# Patient Record
Sex: Female | Born: 1980 | Race: Black or African American | Hispanic: No | Marital: Single | Smoking: Current some day smoker
Health system: Southern US, Community
[De-identification: ages and names within clinical notes are randomized; demographics above are authoritative.]

## PROBLEM LIST (undated history)

## (undated) DIAGNOSIS — K469 Unspecified abdominal hernia without obstruction or gangrene: Secondary | ICD-10-CM

---

## 2012-05-11 ENCOUNTER — Emergency Department (HOSPITAL_COMMUNITY): Payer: No Typology Code available for payment source

## 2012-05-11 ENCOUNTER — Emergency Department (HOSPITAL_COMMUNITY)
Admission: EM | Admit: 2012-05-11 | Discharge: 2012-05-11 | Disposition: A | Discharge status: Home / Self Care | Payer: No Typology Code available for payment source | Attending: Emergency Medicine | Admitting: Emergency Medicine

## 2012-05-11 ENCOUNTER — Encounter (HOSPITAL_COMMUNITY): Payer: Self-pay | Admitting: *Deleted

## 2012-05-11 DIAGNOSIS — S8000XA Contusion of unspecified knee, initial encounter: Secondary | ICD-10-CM | POA: Insufficient documentation

## 2012-05-11 DIAGNOSIS — S0990XA Unspecified injury of head, initial encounter: Secondary | ICD-10-CM | POA: Insufficient documentation

## 2012-05-11 DIAGNOSIS — M25559 Pain in unspecified hip: Secondary | ICD-10-CM | POA: Insufficient documentation

## 2012-05-11 DIAGNOSIS — M549 Dorsalgia, unspecified: Secondary | ICD-10-CM | POA: Insufficient documentation

## 2012-05-11 DIAGNOSIS — R079 Chest pain, unspecified: Secondary | ICD-10-CM | POA: Insufficient documentation

## 2012-05-11 DIAGNOSIS — S8002XA Contusion of left knee, initial encounter: Secondary | ICD-10-CM

## 2012-05-11 DIAGNOSIS — S8001XA Contusion of right knee, initial encounter: Secondary | ICD-10-CM

## 2012-05-11 DIAGNOSIS — M5030 Other cervical disc degeneration, unspecified cervical region: Secondary | ICD-10-CM | POA: Insufficient documentation

## 2012-05-11 HISTORY — DX: Unspecified abdominal hernia without obstruction or gangrene: K46.9

## 2012-05-11 LAB — BASIC METABOLIC PANEL
CO2: 18 mEq/L — ABNORMAL LOW (ref 19–32)
Calcium: 9.3 mg/dL (ref 8.4–10.5)
Creatinine, Ser: 0.56 mg/dL (ref 0.50–1.10)
Glucose, Bld: 97 mg/dL (ref 70–99)

## 2012-05-11 LAB — CBC
HCT: 41.6 % (ref 36.0–46.0)
Hemoglobin: 14 g/dL (ref 12.0–15.0)
MCH: 30 pg (ref 26.0–34.0)
MCV: 89.1 fL (ref 78.0–100.0)
RBC: 4.67 MIL/uL (ref 3.87–5.11)

## 2012-05-11 MED ORDER — OXYCODONE-ACETAMINOPHEN 5-325 MG PO TABS: 1.0000 | ORAL_TABLET | ORAL | Status: AC | PRN

## 2012-05-11 MED ORDER — ONDANSETRON HCL 4 MG/2ML IJ SOLN
4.0000 mg | Freq: Once | INTRAMUSCULAR | Status: AC
  Administered 2012-05-11: 4 mg via INTRAVENOUS
  Filled 2012-05-11: qty 2

## 2012-05-11 MED ORDER — HYDROMORPHONE HCL PF 1 MG/ML IJ SOLN
1.0000 mg | Freq: Once | INTRAMUSCULAR | Status: AC
  Administered 2012-05-11: 1 mg via INTRAVENOUS
  Filled 2012-05-11: qty 1

## 2012-05-11 NOTE — ED Notes (Signed)
Patient is AOx4 and comfortable with her discharge instructions.  Patent's family is driving her home.
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
## 2013-07-16 IMAGING — CR DG HIP COMPLETE 2+V*R*
2 series · 2 of 2 positions shown · non-contrast
Comparison: Contemporaneous pelvis radiograph

CLINICAL DATA: Right hip pain

RIGHT HIP - COMPLETE 2+ VIEW

[t hip ap right]
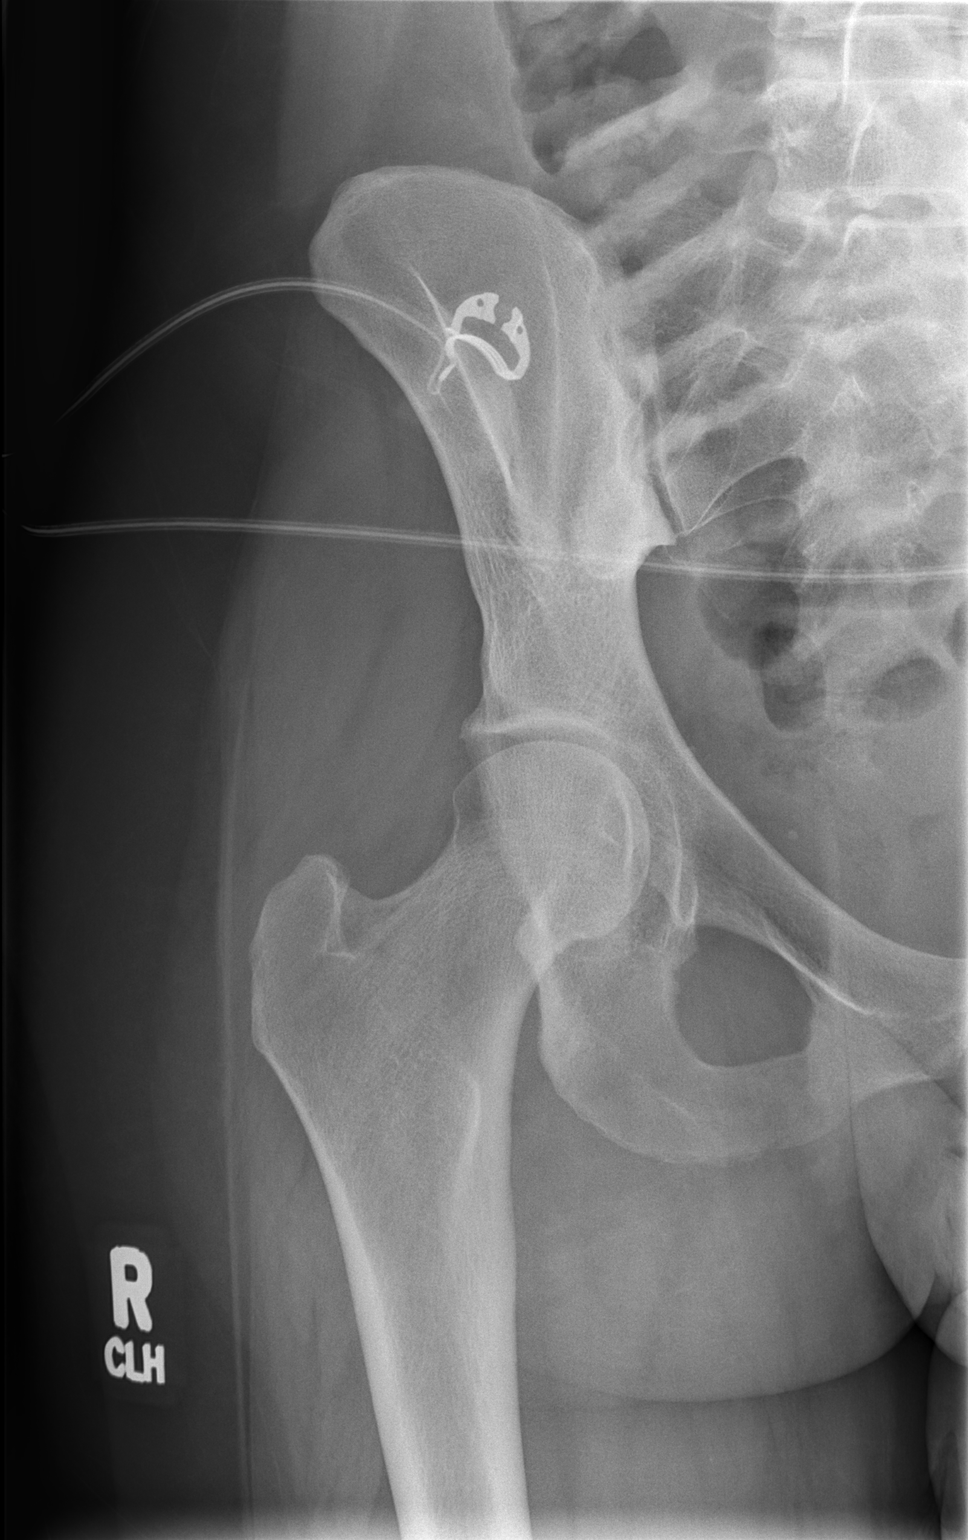

[w hip lat right]
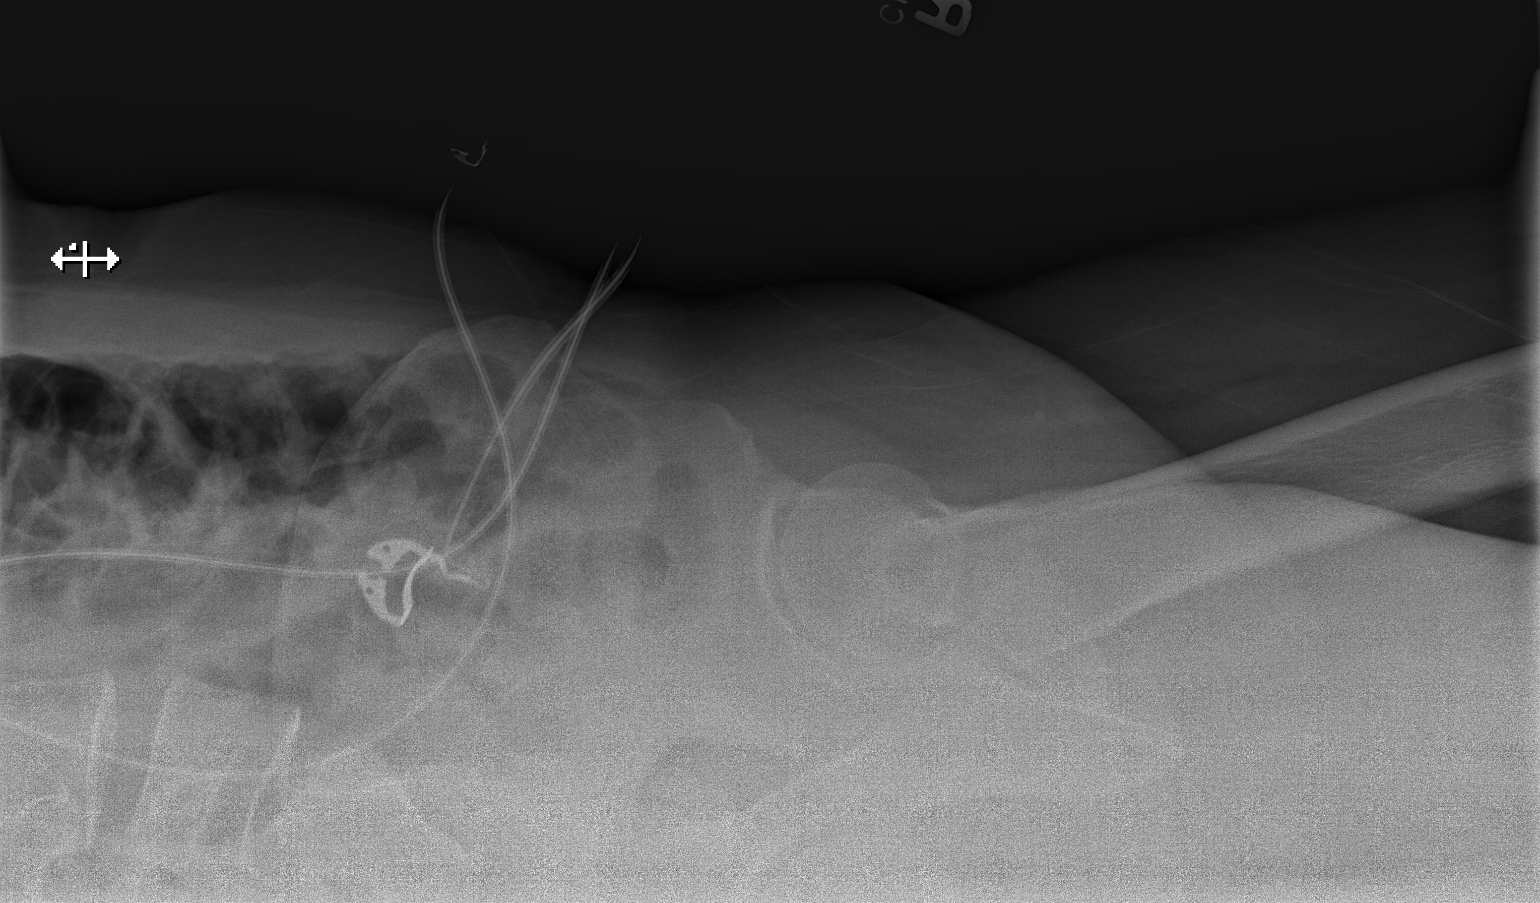

[2 of 2 positions shown; findings below may reference images not displayed]

FINDINGS: No fracture or dislocation. Sclerosis along the iliac
side of the SI joint.  No aggressive osseous lesion.
IMPRESSION: No acute osseous abnormality. If clinical concern for a fracture
persists, recommend a repeat radiograph in 5-10 days to evaluate
for interval change or callus formation.

## 2019-11-04 ENCOUNTER — Encounter: Payer: Self-pay | Admitting: Advanced Practice Midwife

## 2020-03-19 ENCOUNTER — Emergency Department (HOSPITAL_COMMUNITY)
Admission: EM | Admit: 2020-03-19 | Discharge: 2020-03-19 | Disposition: A | Discharge status: Home / Self Care | Payer: Medicaid Other | Attending: Emergency Medicine | Admitting: Emergency Medicine

## 2020-03-19 ENCOUNTER — Other Ambulatory Visit: Payer: Self-pay

## 2020-03-19 DIAGNOSIS — R102 Pelvic and perineal pain: Secondary | ICD-10-CM

## 2020-03-19 DIAGNOSIS — A599 Trichomoniasis, unspecified: Secondary | ICD-10-CM | POA: Insufficient documentation

## 2020-03-19 DIAGNOSIS — F17200 Nicotine dependence, unspecified, uncomplicated: Secondary | ICD-10-CM | POA: Insufficient documentation

## 2020-03-19 LAB — CBC
HCT: 42 % (ref 36.0–46.0)
Hemoglobin: 13.4 g/dL (ref 12.0–15.0)
MCH: 30.9 pg (ref 26.0–34.0)
MCHC: 31.9 g/dL (ref 30.0–36.0)
MCV: 97 fL (ref 80.0–100.0)
Platelets: 226 10*3/uL (ref 150–400)
RBC: 4.33 MIL/uL (ref 3.87–5.11)
RDW: 15 % (ref 11.5–15.5)
WBC: 5.9 10*3/uL (ref 4.0–10.5)
nRBC: 0 % (ref 0.0–0.2)

## 2020-03-19 LAB — WET PREP, GENITAL
Clue Cells Wet Prep HPF POC: NONE SEEN
Sperm: NONE SEEN
Yeast Wet Prep HPF POC: NONE SEEN

## 2020-03-19 LAB — URINALYSIS, ROUTINE W REFLEX MICROSCOPIC
Bilirubin Urine: NEGATIVE
Glucose, UA: NEGATIVE mg/dL
Ketones, ur: NEGATIVE mg/dL
Nitrite: POSITIVE — AB
Protein, ur: NEGATIVE mg/dL
Specific Gravity, Urine: 1.015 (ref 1.005–1.030)
pH: 6 (ref 5.0–8.0)

## 2020-03-19 LAB — COMPREHENSIVE METABOLIC PANEL
ALT: 15 U/L (ref 0–44)
AST: 12 U/L — ABNORMAL LOW (ref 15–41)
Albumin: 3.3 g/dL — ABNORMAL LOW (ref 3.5–5.0)
Alkaline Phosphatase: 50 U/L (ref 38–126)
Anion gap: 8 (ref 5–15)
BUN: 6 mg/dL (ref 6–20)
CO2: 26 mmol/L (ref 22–32)
Calcium: 8.6 mg/dL — ABNORMAL LOW (ref 8.9–10.3)
Chloride: 105 mmol/L (ref 98–111)
Creatinine, Ser: 0.71 mg/dL (ref 0.44–1.00)
GFR calc Af Amer: >60 mL/min (ref >60)
GFR calc non Af Amer: >60 mL/min (ref >60)
Glucose, Bld: 89 mg/dL (ref 70–99)
Potassium: 3.6 mmol/L (ref 3.5–5.1)
Sodium: 139 mmol/L (ref 135–145)
Total Bilirubin: 0.4 mg/dL (ref 0.3–1.2)
Total Protein: 6.4 g/dL — ABNORMAL LOW (ref 6.5–8.1)

## 2020-03-19 LAB — I-STAT BETA HCG BLOOD, ED (MC, WL, AP ONLY): I-stat hCG, quantitative: <5 m[IU]/mL (ref <5)

## 2020-03-19 LAB — LIPASE, BLOOD: Lipase: 22 U/L (ref 11–51)

## 2020-03-19 MED ORDER — METRONIDAZOLE 500 MG PO TABS
2000.0000 mg | ORAL_TABLET | Freq: Once | ORAL | Status: AC
  Administered 2020-03-19: 2000 mg via ORAL
  Filled 2020-03-19: qty 4

## 2020-03-19 MED ORDER — NAPROXEN 250 MG PO TABS
500.0000 mg | ORAL_TABLET | Freq: Once | ORAL | Status: AC
  Administered 2020-03-19: 17:00:00 500 mg via ORAL
  Filled 2020-03-19: qty 2

## 2020-03-19 MED ORDER — AZITHROMYCIN 1 G PO PACK
1.0000 g | PACK | Freq: Once | ORAL | Status: AC
  Administered 2020-03-19: 18:00:00 1 g via ORAL
  Filled 2020-03-19: qty 1

## 2020-03-19 MED ORDER — CEFTRIAXONE SODIUM 500 MG IJ SOLR
500.0000 mg | Freq: Once | INTRAMUSCULAR | Status: AC
  Administered 2020-03-19: 18:00:00 500 mg via INTRAMUSCULAR
  Filled 2020-03-19: qty 500

## 2020-03-19 MED ORDER — SODIUM CHLORIDE 0.9% FLUSH
3.0000 mL | Freq: Once | INTRAVENOUS | Status: AC
  Administered 2020-03-19: 3 mL via INTRAVENOUS

## 2020-03-19 NOTE — ED Triage Notes (Signed)
C/o abdominal pain and cramps since last Wednesday along w/ nausea and unable to keep food down. . Pt verbalized concern for potential food poisoning.stated hx of miscarriage in 2018.

## 2020-03-19 NOTE — Discharge Instructions (Signed)
You are seen today for pelvic pain.  It appears that you have a sexually transmitted infection called the trichomoniasis.  We have also tested you for gonorrhea and chlamydia but these tests are not resulted yet.  We have treated you for gonorrhea, chlamydia and trichomoniasis.  Please make sure you let your sexual partners know of your diagnosis so that they may be treated as well.  No sex for at least 1 week and follow-up with primary care, OB/GYN or the health department.  Please return to the emergency department if you have any new or worsening symptoms

## 2020-03-19 NOTE — ED Notes (Signed)
Patient given discharge instructions. Questions were answered. Patient verbalized understanding of discharge instructions and care at home.  

## 2020-03-19 NOTE — ED Provider Notes (Signed)
[facility] EMERGENCY DEPARTMENT Provider Note   CSN: [id number] Arrival date & time: 03/19/20  1425     History Chief Complaint  Patient presents with  . Abdominal Pain    Debbie Nguyen is a 39 y.o. female.  Patient is a 39 year old female with no past medical history presenting to the emergency department for lower abdominal and pelvic cramping with urinary urgency since Wednesday.  She is sexually active.  Denies any fever, chills, nausea, vomiting.  Reports body aches and feeling fatigued.  Reports small amount of vaginal discharge but no vaginal bleeding        Past Medical History:  Diagnosis Date  . Hernia     There are no problems to display for this patient.   No past surgical history on file.   OB History   No obstetric history on file.     No family history on file.  Social History   Tobacco Use  . Smoking status: Current Some Day Smoker  Substance Use Topics  . Alcohol use: Yes  . Drug use: Yes    Types: Marijuana    Home Medications Prior to Admission medications   Not on File    Allergies    Patient has no known allergies.  Review of Systems   Review of Systems  Constitutional: Negative for chills and fever.  HENT: Negative for ear pain and sore throat.   Eyes: Negative for pain and visual disturbance.  Respiratory: Negative for cough and shortness of breath.   Cardiovascular: Negative for chest pain and palpitations.  Gastrointestinal: Positive for abdominal pain. Negative for blood in stool, constipation, nausea and vomiting.  Genitourinary: Positive for urgency and vaginal discharge. Negative for difficulty urinating, dysuria, flank pain, hematuria, vaginal bleeding and vaginal pain.  Musculoskeletal: Negative for arthralgias and back pain.  Skin: Negative for color change and rash.  Neurological: Negative for seizures and syncope.  All other systems reviewed and are negative.   Physical Exam Updated Vital  Signs BP (!) 126/104 (BP Location: Right Arm)   Pulse 85   Temp 98.5 F (36.9 C) (Oral)   Resp 18   SpO2 99%   Physical Exam Vitals and nursing note reviewed. Exam conducted with a chaperone present.  Constitutional:      General: She is not in acute distress.    Appearance: Normal appearance. She is well-developed. She is not ill-appearing, toxic-appearing or diaphoretic.  HENT:     Head: Normocephalic.  Eyes:     Conjunctiva/sclera: Conjunctivae normal.  Cardiovascular:     Rate and Rhythm: Normal rate and regular rhythm.  Pulmonary:     Effort: Pulmonary effort is normal.  Abdominal:     General: Abdomen is flat.     Palpations: Abdomen is soft.     Tenderness: There is abdominal tenderness in the suprapubic area.  Genitourinary:    Labia:        Right: No rash, tenderness, lesion or injury.        Left: Lesion present. No rash, tenderness or injury.      Vagina: Vaginal discharge present.     Cervix: Normal.     Adnexa: Right adnexa normal and left adnexa normal.    Skin:    General: Skin is dry.  Neurological:     Mental Status: She is alert.  Psychiatric:        Mood and Affect: Mood normal.     ED Results / Procedures / Treatments  Labs (all labs ordered are listed, but only abnormal results are displayed) Labs Reviewed  WET PREP, GENITAL - Abnormal; Notable for the following components:      Result Value   Trich, Wet Prep PRESENT (*)    WBC, Wet Prep HPF POC MODERATE (*)    All other components within normal limits  COMPREHENSIVE METABOLIC PANEL - Abnormal; Notable for the following components:   Calcium 8.6 (*)    Total Protein 6.4 (*)    Albumin 3.3 (*)    AST 12 (*)    All other components within normal limits  URINALYSIS, ROUTINE W REFLEX MICROSCOPIC - Abnormal; Notable for the following components:   APPearance HAZY (*)    Hgb urine dipstick SMALL (*)    Nitrite POSITIVE (*)    Leukocytes,Ua MODERATE (*)    Bacteria, UA FEW (*)    All  other components within normal limits  URINE CULTURE  LIPASE, BLOOD  CBC  I-STAT BETA HCG BLOOD, ED ([facility], [facility], [facility] ONLY)  GC/CHLAMYDIA PROBE AMP ([place]) NOT AT [facility]    EKG None  Radiology No results found.  Procedures Procedures (including critical care time)  Medications Ordered in ED Medications  sodium chloride flush (NS) 0.9 % injection 3 mL (3 mLs Intravenous Given 03/19/20 1825)  naproxen (NAPROSYN) tablet 500 mg (500 mg Oral Given 03/19/20 1708)  cefTRIAXone (ROCEPHIN) injection 500 mg (500 mg Intramuscular Given 03/19/20 1822)  azithromycin (ZITHROMAX) powder 1 g (1 g Oral Given 03/19/20 1820)  metroNIDAZOLE (FLAGYL) tablet 2,000 mg (2,000 mg Oral Given 03/19/20 1819)    ED Course  I have reviewed the triage vital signs and the nursing notes.  Pertinent labs & imaging results that were available during my care of the patient were reviewed by me and considered in my medical decision making (see chart for details).  Clinical Course as of Mar 20 1839  Fri Mar 19, 2020  1747 Patient with abdominal cramping and pain for the last couple of days.  Appears well on exam with unremarkable CMP and CBC.  She is not pregnant.  Her urinalysis is concerning for UTI.  Pelvic exam revealed some vaginal discharge but no cervical motion tenderness or adnexal tenderness.  Discussed with patient any concerns for STD and she does wish to be treated for gonorrhea and chlamydia today.  She did have a pustule on her left labia.  This is consistent with a probable bump from shaving.  No concern for HSV.  Discussed with patient.   [KM]  1839 Patient results are positive for trichomonas.  She is treated with Rocephin, Zithromax and metronidazole.  Overall she appears well and there is no concern for PID based on her exam.  She was advised on safe sex practices, return precautions and proper follow-up   [KM]    Clinical Course User Index [KM] Jeral Pinch   MDM Rules/Calculators/A&P                       Based on review of vitals, medical screening exam, lab work and/or imaging, there does not appear to be an acute, emergent etiology for the patient's symptoms. Counseled pt on good return precautions and encouraged both PCP and ED follow-up as needed.  Prior to discharge, I also discussed incidental imaging findings with patient in detail and advised appropriate, recommended follow-up in detail.  Clinical Impression: 1. Trichimoniasis   2. Pelvic pain in female     Disposition:  Discharge  Prior to providing a prescription for a controlled substance, I independently reviewed the patient's recent prescription history on the [facility]. The patient had no recent or regular prescriptions and was deemed appropriate for a brief, less than 3 day prescription of narcotic for acute analgesia.  This note was prepared with assistance of Systems analyst. Occasional wrong-word or sound-a-like substitutions may have occurred due to the inherent limitations of voice recognition software.  Final Clinical Impression(s) / ED Diagnoses Final diagnoses:  Trichimoniasis  Pelvic pain in female    Rx / DC Orders ED Discharge Orders    None       Kristine Royal 03/19/20 1840    Hayden Rasmussen, MD 03/19/20 2232

## 2020-03-22 LAB — GC/CHLAMYDIA PROBE AMP (~~LOC~~) NOT AT ARMC
Chlamydia: NEGATIVE
Comment: NEGATIVE
Comment: NORMAL
Neisseria Gonorrhea: NEGATIVE

## 2020-03-22 LAB — URINE CULTURE: Culture: >=100000 — AB

## 2020-03-23 ENCOUNTER — Telehealth: Payer: Self-pay | Admitting: Emergency Medicine

## 2020-03-23 NOTE — Telephone Encounter (Signed)
Post ED Visit - Positive Culture Follow-up: Successful Patient Follow-Up  Culture assessed and recommendations reviewed by:  []  , Pharm.D. []  Enzo Bi, Pharm.D., BCPS AQ-ID []  , Pharm.D., BCPS []  Celedonio Miyamoto, Pharm.D., BCPS []  [place], Garvin Fila.D., BCPS, AAHIVP []  , Pharm.D., BCPS, AAHIVP []  Georgina Pillion, PharmD, BCPS []  , PharmD, BCPS []  [place], PharmD, BCPS []  [address], PharmD  Positive urine culture  [x]  Patient discharged without antimicrobial prescription and treatment is now indicated []  Organism is resistant to prescribed ED discharge antimicrobial []  Patient with positive blood cultures  Changes discussed with ED provider: Dr New antibiotic prescription start macrobid 100mg  po bid x 5 days  Attempting to contact patient   Estella Husk 03/23/2020, 12:40 PM

## 2020-03-23 NOTE — Progress Notes (Signed)
ED Antimicrobial Stewardship Positive Culture Follow Up   Debbie Nguyen is an 39 y.o. female who presented to [facility] on 03/19/2020 with a chief complaint of abdominal pain.  Chief Complaint  Patient presents with  . Abdominal Pain    Recent Results (from the past 720 hour(s))  Urine culture     Status: Abnormal   Collection Time: 03/19/20  3:24 PM   Specimen: Urine, Random  Result Value Ref Range Status   Specimen Description URINE, RANDOM  Final   Special Requests   Final    NONE Performed at [facility], [address] [place], [place] [address]    Culture >=100,000 COLONIES/mL ESCHERICHIA COLI (A)  Final   Report Status 03/22/2020 FINAL  Final   Organism ID, Bacteria ESCHERICHIA COLI (A)  Final      Susceptibility   Escherichia coli - MIC*    AMPICILLIN >=32 RESISTANT Resistant     CEFAZOLIN <=4 SENSITIVE Sensitive     CEFTRIAXONE <=0.25 SENSITIVE Sensitive     CIPROFLOXACIN <=0.25 SENSITIVE Sensitive     GENTAMICIN >=16 RESISTANT Resistant     IMIPENEM <=0.25 SENSITIVE Sensitive     NITROFURANTOIN <=16 SENSITIVE Sensitive     TRIMETH/SULFA >=320 RESISTANT Resistant     AMPICILLIN/SULBACTAM >=32 RESISTANT Resistant     PIP/TAZO <=4 SENSITIVE Sensitive     * >=100,000 COLONIES/mL ESCHERICHIA COLI  Wet prep, genital     Status: Abnormal   Collection Time: 03/19/20  5:43 PM  Result Value Ref Range Status   Yeast Wet Prep HPF POC NONE SEEN NONE SEEN Final   Trich, Wet Prep PRESENT (A) NONE SEEN Final   Clue Cells Wet Prep HPF POC NONE SEEN NONE SEEN Final   WBC, Wet Prep HPF POC MODERATE (A) NONE SEEN Final   Sperm NONE SEEN  Final    Comment: Performed at [facility], [address] [place], [place] [address]    [x]  Patient discharged originally without antimicrobial agent and treatment is now indicated  New antibiotic prescription: macrobid (nitrofurantoin) 100 mg, take 1 capsule by mouth BID for 5 days  ED Provider: ,  MD   Cathren Laine 03/23/2020, 11:17 AM Clinical Pharmacist Monday - Friday phone -  [phone] Saturday - Sunday phone - [phone]
